# Patient Record
Sex: Female | Born: 1975 | Race: Black or African American | Hispanic: No | Marital: Single | Smoking: Never smoker
Health system: Southern US, Community
[De-identification: ages and names within clinical notes are randomized; demographics above are authoritative.]

---

## 1998-12-10 ENCOUNTER — Ambulatory Visit (HOSPITAL_BASED_OUTPATIENT_CLINIC_OR_DEPARTMENT_OTHER): Admission: RE | Admit: 1998-12-10 | Discharge: 1998-12-10 | Payer: Self-pay | Admitting: Orthopaedic Surgery

## 1999-09-23 ENCOUNTER — Emergency Department (HOSPITAL_COMMUNITY): Admission: EM | Admit: 1999-09-23 | Discharge: 1999-09-23 | Payer: Self-pay | Admitting: *Deleted

## 2000-11-04 ENCOUNTER — Emergency Department (HOSPITAL_COMMUNITY): Admission: EM | Admit: 2000-11-04 | Discharge: 2000-11-05 | Payer: Self-pay | Admitting: Emergency Medicine

## 2001-03-19 ENCOUNTER — Emergency Department (HOSPITAL_COMMUNITY): Admission: EM | Admit: 2001-03-19 | Discharge: 2001-03-21 | Payer: Self-pay | Admitting: Emergency Medicine

## 2001-03-20 ENCOUNTER — Encounter: Payer: Self-pay | Admitting: Emergency Medicine

## 2001-03-21 ENCOUNTER — Emergency Department (HOSPITAL_COMMUNITY): Admission: EM | Admit: 2001-03-21 | Discharge: 2001-03-21 | Payer: Self-pay | Admitting: Emergency Medicine

## 2002-01-25 ENCOUNTER — Other Ambulatory Visit: Admission: RE | Admit: 2002-01-25 | Discharge: 2002-01-25 | Payer: Self-pay | Admitting: Obstetrics and Gynecology

## 2002-01-25 ENCOUNTER — Other Ambulatory Visit: Admission: RE | Admit: 2002-01-25 | Discharge: 2002-01-25 | Payer: Self-pay | Admitting: Gynecology

## 2002-07-14 ENCOUNTER — Emergency Department (HOSPITAL_COMMUNITY): Admission: EM | Admit: 2002-07-14 | Discharge: 2002-07-14 | Payer: Self-pay | Admitting: Emergency Medicine

## 2002-07-30 ENCOUNTER — Encounter: Payer: Self-pay | Admitting: Emergency Medicine

## 2002-07-30 ENCOUNTER — Emergency Department (HOSPITAL_COMMUNITY): Admission: EM | Admit: 2002-07-30 | Discharge: 2002-07-30 | Payer: Self-pay | Admitting: Emergency Medicine

## 2002-10-07 ENCOUNTER — Emergency Department (HOSPITAL_COMMUNITY): Admission: EM | Admit: 2002-10-07 | Discharge: 2002-10-07 | Payer: Self-pay | Admitting: Emergency Medicine

## 2003-09-08 ENCOUNTER — Emergency Department (HOSPITAL_COMMUNITY): Admission: EM | Admit: 2003-09-08 | Discharge: 2003-09-08 | Payer: Self-pay | Admitting: Emergency Medicine

## 2004-07-17 ENCOUNTER — Emergency Department (HOSPITAL_COMMUNITY): Admission: EM | Admit: 2004-07-17 | Discharge: 2004-07-18 | Payer: Self-pay | Admitting: Emergency Medicine

## 2005-03-15 ENCOUNTER — Emergency Department (HOSPITAL_COMMUNITY): Admission: EM | Admit: 2005-03-15 | Discharge: 2005-03-16 | Payer: Self-pay | Admitting: Emergency Medicine

## 2005-03-17 ENCOUNTER — Other Ambulatory Visit: Admission: RE | Admit: 2005-03-17 | Discharge: 2005-03-17 | Payer: Self-pay | Admitting: Gynecology

## 2005-03-24 ENCOUNTER — Encounter (INDEPENDENT_AMBULATORY_CARE_PROVIDER_SITE_OTHER): Payer: Self-pay | Admitting: *Deleted

## 2005-03-25 ENCOUNTER — Inpatient Hospital Stay (HOSPITAL_COMMUNITY): Admission: RE | Admit: 2005-03-25 | Discharge: 2005-03-26 | Payer: Self-pay | Admitting: *Deleted

## 2005-03-30 ENCOUNTER — Ambulatory Visit (HOSPITAL_COMMUNITY): Admission: RE | Admit: 2005-03-30 | Discharge: 2005-03-30 | Payer: Self-pay | Admitting: *Deleted

## 2005-11-14 ENCOUNTER — Emergency Department (HOSPITAL_COMMUNITY): Admission: EM | Admit: 2005-11-14 | Discharge: 2005-11-14 | Payer: Self-pay | Admitting: Emergency Medicine

## 2006-02-08 ENCOUNTER — Emergency Department (HOSPITAL_COMMUNITY): Admission: EM | Admit: 2006-02-08 | Discharge: 2006-02-08 | Payer: Self-pay | Admitting: Emergency Medicine

## 2007-10-13 ENCOUNTER — Emergency Department (HOSPITAL_COMMUNITY): Admission: EM | Admit: 2007-10-13 | Discharge: 2007-10-13 | Payer: Self-pay | Admitting: *Deleted

## 2008-02-28 ENCOUNTER — Emergency Department (HOSPITAL_COMMUNITY): Admission: EM | Admit: 2008-02-28 | Discharge: 2008-02-28 | Payer: Self-pay | Admitting: Emergency Medicine

## 2008-11-05 ENCOUNTER — Emergency Department (HOSPITAL_COMMUNITY): Admission: EM | Admit: 2008-11-05 | Discharge: 2008-11-05 | Payer: Self-pay | Admitting: Emergency Medicine

## 2010-08-19 IMAGING — CR DG CHEST 2V
2 series · 2 of 2 positions shown · non-contrast
Comparison: 10/13/2007

CLINICAL DATA: Neck and back pain

CHEST - 2 VIEW

[view not recorded (1 of 2)]
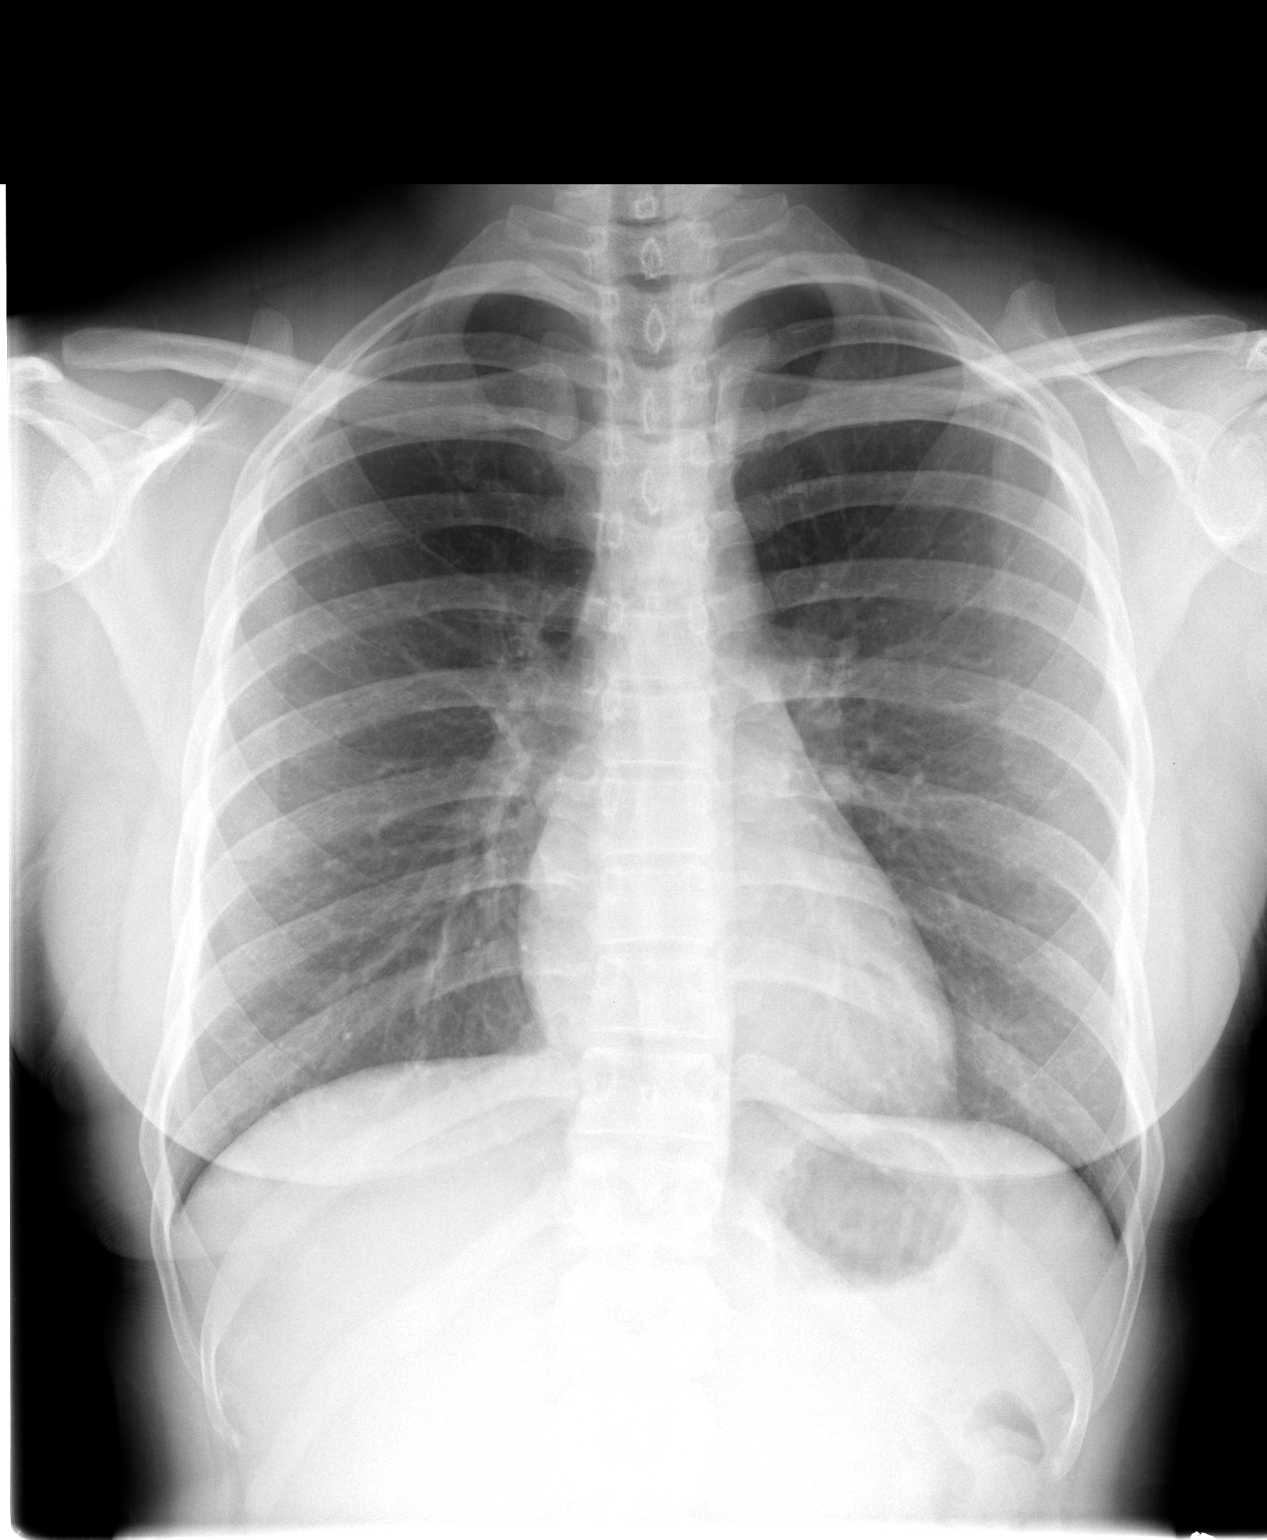

[view not recorded (2 of 2)]
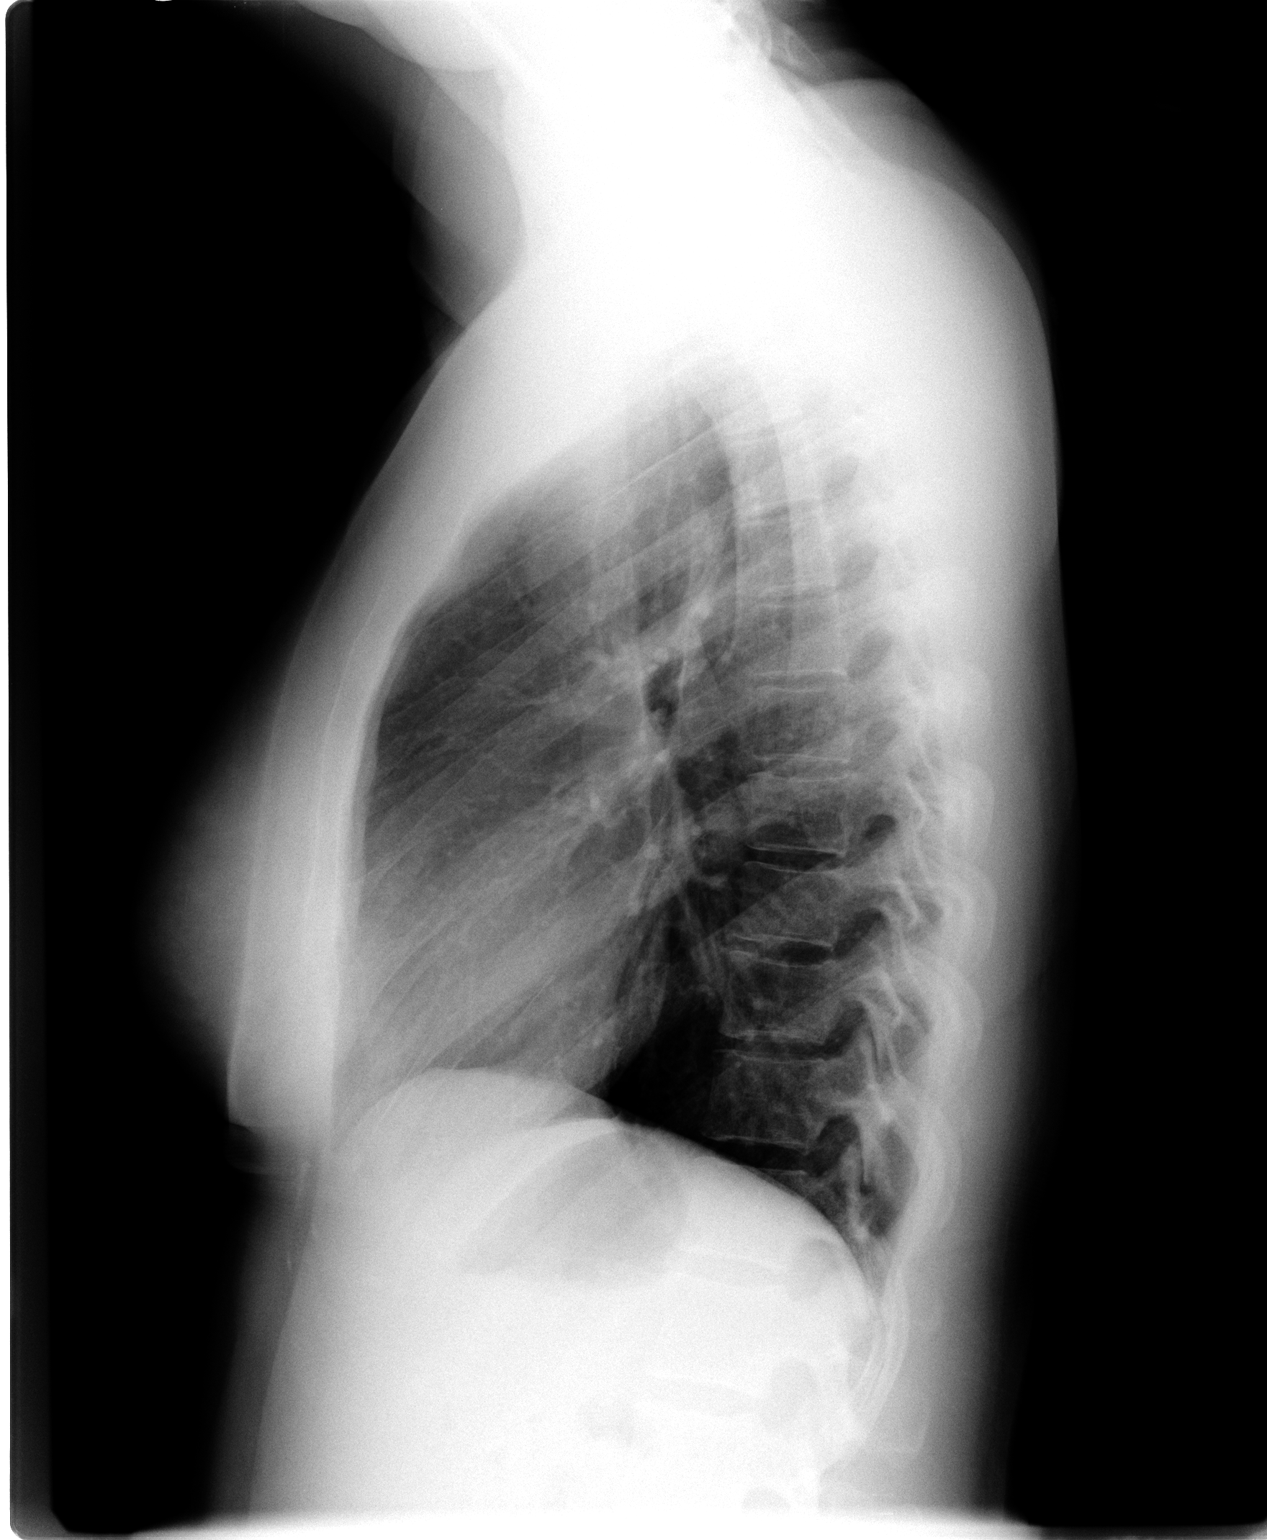

[2 of 2 positions shown; findings below may reference images not displayed]

FINDINGS: Heart and mediastinal contours normal.  Lungs clear.  No
pleural fluid.  Osseous structures and soft tissues unremarkable.
IMPRESSION: No acute or significant findings.

## 2010-11-21 NOTE — Op Note (Signed)
NAME:  Shelley Mcdaniel, Shelley Mcdaniel NO.:  [id number]   MEDICAL RECORD NO.:  [id number]          PATIENT TYPE:  OIB   LOCATION:  1617                         FACILITY:  [facility]   PHYSICIAN:  Almedia Balls. Fore, M.D.   DATE OF BIRTH:  December 13, 1975   DATE OF PROCEDURE:  03/24/2005  DATE OF DISCHARGE:                                 OPERATIVE REPORT   PREOPERATIVE DIAGNOSES:  1.  Pelvic pain.  2.  Uterine fibroids.  3.  Ovarian cyst.   POSTOPERATIVE DIAGNOSES:  1.  Pelvic pain.  2.  Uterine fibroids.  3.  Ovarian cyst.  4.  Pending pathology.   OPERATION:  1.  Abdominal myomectomy.  2.  Excision of lesion of right ovary.  3.  Chromopertubation.   ANESTHESIA:  General orotracheal.   OPERATOR:  Dr. Randell Patient   FIRST ASSISTANT:  Dr. Laureen Ochs   INDICATIONS FOR SURGERY:  The patient is a 35 year old with the above-noted  problems, who has been counseled as to the need for surgery to treat these  problems.  She has been fully counseled as to the nature of the procedure  and the risks involved to include risks of anesthesia; injury to uterus,  tubes, ovaries, bowel, bladder, blood vessels, ureters, postoperative  hemorrhage, infection, recuperation.  She fully understands all these  considerations and has signed informed consent to proceed on 24 March 2005.   OPERATIVE FINDINGS:  On entry into the abdomen, the upper abdominal viscera  to include liver, gallbladder, spleen, kidneys, periaortic areas were normal  to palpation.  The appendix appeared to be normal and was palpated as  normal.  In the pelvis, there were multiple myomata on the external surface  of the uterus with one approximately 2 cm myomata in the right posterior  intramural area.  There was corpus luteum present on the right ovary as well  as a solid lesion present at the distal pole of the right ovary as well.  Both tubes appeared normal with normal fimbria.  Attempted chromopertubation  was accomplished to  ensure the tubes were opened; however, no dye was  definitely seen to transmit either tube.   PROCEDURE:  With the patient under general anesthesia, prepared and draped  in the usual sterile fashion, speculum was placed in vagina.  The anterior  lip of the cervix was grasped with a single-tooth tenaculum, and the cervix  was dilated up through a number 15 Pratt dilator.  Pediatric Foley catheter  was then placed with the bulb inflated to ensure that it remained within the  endometrial cavity for insufflation with indigo carmine dye.  Foley catheter  was placed, and the patient was positioned for an abdominal procedure.  The  patient was then draped sterilely, and an incision was made in the lower  abdomen in a transverse fashion and carried into the peritoneal cavity  without difficulty.  Self-retaining retractor was placed, and the bowel was  packed off.  A solution of Pitressin 20 units and 30 mL saline were used at  the base of each fibroid which were then removed using sharp dissection with  Bovie electrocoagulation utilized for hemostasis as well.  There was 1 large  myoma at the anterior fundal surface which is sessile-type myoma without a  definite stalk.  The lesion on the right ovary was excised using Bovie  electrocoagulation and appeared to be a solid lesion.  The defects in the  uterus were closed in deep layers of 1 chromic catgut and/or 0 chromic  catgut.  The serosal layers were closed with baseball-type suture of 0 PDS.  Hemostasis was maintained using suture ligatures and/or Bovie  electrocoagulation.  The area was lavaged with copious amounts of lactated  Ringer solution and after noting that hemostasis was maintained and that  sponge and instrument counts were correct, the peritoneum was closed with a  continuous suture of 0 Vicryl.  Fascia was closed with 2 sutures of 0 Vicryl  which were brought from the lateral aspects of the incision and tied  separately in the  midline.  Subcutaneous fat was reapproximated with  interrupted sutures of 0 Vicryl.  Skin was closed with a subcuticular suture  of 3-0 plain catgut.  Estimated blood loss 150 mL.  The patient was taken to  the recovery room in good condition with clear urine in the Foley catheter  tubing.  The pediatric Foley catheter was removed from the intrauterine  cavity.  She will be placed on 23-hour observation following surgery.           ______________________________  Almedia Balls Randell Patient, M.D.     SRF/MEDQ  D:  03/24/2005  T:  03/24/2005  Job:  [id number]

## 2010-11-21 NOTE — Discharge Summary (Signed)
NAME:  BLASA, RAISCH NO.:  [id number]   MEDICAL RECORD NO.:  [id number]          PATIENT TYPE:  INP   LOCATION:  1617                         FACILITY:  [facility]   PHYSICIAN:  Almedia Balls. Fore, M.D.   DATE OF BIRTH:  May 03, 1976   DATE OF ADMISSION:  03/24/2005  DATE OF DISCHARGE:  03/26/2005                                 DISCHARGE SUMMARY   HISTORY OF PRESENT ILLNESS:  The patient is a 35 year old with pelvic pain  and leiomyomata uteri who had been evaluated by Dr. Chevis Pretty, and referred to  [place] for surgery.  The remainder of her history and physical as previously  dictated.   LABORATORY DATA:  Includes preoperative hemoglobin of 12.3.  Pregnancy test  was negative.   HOSPITAL COURSE:  The patient was taken to the operating room on March 24, 2005, at which time abdominal myomectomy, excision of lesion of right  ovary and chromopertubation were performed.  The patient did well  postoperatively.  Diet and ambulation was progressed over several days  postoperatively.  On the morning of March 26, 2005, she was afebrile,  and experiencing no problems except for pain which was controlled by oral  analgesics.  It was felt that she could be discharged at this time.   FINAL DIAGNOSES:  1.  Pelvic pain.  2.  Leiomyomata uteri.  3.  Lesion of right ovary with corpus luteum cyst, right ovary.   OPERATIONS:  1.  Abdominal myomectomy.  2.  Excision of lesion, right ovary.  3.  Chromopertubation.   PATHOLOGY:  Pathology report unavailable at the time of dictation.   DISPOSITION:  Discharged home to return to the office in 2 weeks for  followup.  She was instructed to gradually progress her activities over  several weeks at home and to limit lifting and driving for 2 weeks.  She was  fully  ambulatory, on a regular diet and in good condition at the time of  dictation.  She was given a prescription for Percocet, generic, #30, to be  taken 1 or 2 q.6-8h. p.r.n. pain  and doxycycline  100 mg, #12, to be taken 1 b.i.d.           ______________________________  Almedia Balls. Randell Patient, M.D.     SRF/MEDQ  D:  03/26/2005  T:  03/26/2005  Job:  [id number]   cc:   Leatha Gilding. Mezer, M.D.  [address]  [place]  [place] [address]  Fax: [phone]

## 2010-11-21 NOTE — H&P (Signed)
NAME:  Shelley Mcdaniel, Shelley Mcdaniel NO.:  [id number]   MEDICAL RECORD NO.:  [id number]           PATIENT TYPE:   LOCATION:                                 FACILITY:   PHYSICIAN:  Almedia Balls. Fore, M.D.        DATE OF BIRTH:   DATE OF ADMISSION:  DATE OF DISCHARGE:                                HISTORY & PHYSICAL   DATE OF ADMISSION:  March 24, 2005   CHIEF COMPLAINT:  Pain, fibroids, abnormal bleeding.   HISTORY:  The patient is a 35 year old gravida 0 whose last menstrual period  was March 04, 2005. She was initially seen by Dr. Teodora Medici on March 17, 2005 with the above-noted complaints. She had been seen in the emergency  room on September 10 with findings of the uterus being enlarged and no  evident etiology of the pain. Dr. Chevis Pretty performed ultrasound which showed  fibroids which were quite tender, with an enlarged uterus, and a cystic  structure on the right ovary approximately 2.5 x 1.6 x 1.8 cm. The pain has  continued and is not relieved by hydrocodone. She is admitted at this time  for exploratory laparotomy, myomectomy, and probable cystectomy as well. She  has been fully counseled as to the nature of the procedure and the risks  involved to include risks of anesthesia; injury to uterus, tubes, ovaries,  bowel, bladder, blood vessels, ureters; postoperative hemorrhage; infection;  recuperation. She fully understands all these considerations and wishes to  proceed on March 24, 2005.   PAST MEDICAL HISTORY:  Includes knee surgery in 2000. She is taking  Hydrocodone at this time for pain. She is allergic to no medications.   FAMILY HISTORY:  Includes a grandmother with hypertension. Father and  maternal aunt with cancer of unknown type.   The patient is a nonsmoker, nondrinker.   REVIEW OF SYSTEMS:  HEENT:  Negative. CARDIORESPIRATORY:  Negative.  GASTROINTESTINAL:  Negative. GENITOURINARY:  As in present illness.  NEUROMUSCULAR:  Negative.   PHYSICAL EXAMINATION:  VITAL SIGNS:  Height 5 feet 5 inches, weight 144  pounds, blood pressure 104/60, pulse 88, respirations 18.  GENERAL:  Well-developed black female in no acute distress.  HEENT:  Within normal limits.  NECK:  Supple without masses, adenopathy or bruits.  HEART:  Regular rate and rhythm without murmurs.  LUNGS:  Clear to P&A.  BREASTS:  Examined sitting and lying without mass.  ABDOMEN:  Flat and soft without mass being palpable. Tender in both lower  quadrants, right greater than left.  PELVIC:  External genitalia, Bartholin's, urethra, and Skene's glands within  normal limits. Cervix is slightly inflamed. Uterus is mid posterior,  approximately 10-[redacted] weeks gestational size, irregular, and tender. Adnexa  are tender bilaterally right greater than left with some fullness in the  right. Anterior and posterior cul-de-sac exam is confirmatory.  EXTREMITIES:  Within normal limits.  CENTRAL NERVOUS SYSTEM:  Grossly intact.  SKIN:  Without suspicious lesions.   IMPRESSION:  1.  Leiomyomata uteri.  2.  Ovarian cyst.   DISPOSITION:  Exploratory laparotomy, myomectomy, probable cystectomy  as  noted above. Pap smear done in September 2006 was normal.           ______________________________  Almedia Balls. Randell Patient, M.D.     SRF/MEDQ  D:  03/19/2005  T:  03/19/2005  Job:  [id number]

## 2011-03-31 LAB — POCT I-STAT, CHEM 8
BUN: 7
Chloride: 102
Creatinine, Ser: 1.2
Glucose, Bld: 88
Hemoglobin: 13.3
Potassium: 3.6
Sodium: 140

## 2011-03-31 LAB — POCT CARDIAC MARKERS
CKMB, poc: <1 — ABNORMAL LOW
Myoglobin, poc: 33.8
Troponin i, poc: <0.05

## 2017-09-18 ENCOUNTER — Emergency Department (HOSPITAL_COMMUNITY): Payer: BLUE CROSS/BLUE SHIELD

## 2017-09-18 ENCOUNTER — Other Ambulatory Visit: Payer: Self-pay

## 2017-09-18 ENCOUNTER — Encounter (HOSPITAL_COMMUNITY): Payer: Self-pay | Admitting: Emergency Medicine

## 2017-09-18 DIAGNOSIS — D649 Anemia, unspecified: Secondary | ICD-10-CM | POA: Insufficient documentation

## 2017-09-18 DIAGNOSIS — R0789 Other chest pain: Secondary | ICD-10-CM | POA: Insufficient documentation

## 2017-09-18 DIAGNOSIS — M546 Pain in thoracic spine: Secondary | ICD-10-CM | POA: Diagnosis not present

## 2017-09-18 LAB — I-STAT TROPONIN, ED: Troponin i, poc: 0 ng/mL (ref 0.00–0.08)

## 2017-09-18 NOTE — ED Triage Notes (Signed)
Reports picking up something earlier today feeling like she pulled something in her back.  Concerned because she now has pain radiating in the left chest under the breast.  [facility] and was given flexeril.  Took some 40 minutes pta with no relief.

## 2017-09-19 ENCOUNTER — Emergency Department (HOSPITAL_COMMUNITY)
Admission: EM | Admit: 2017-09-19 | Discharge: 2017-09-19 | Disposition: A | Discharge status: Home / Self Care | Payer: BLUE CROSS/BLUE SHIELD | Attending: Emergency Medicine | Admitting: Emergency Medicine

## 2017-09-19 DIAGNOSIS — M546 Pain in thoracic spine: Secondary | ICD-10-CM

## 2017-09-19 DIAGNOSIS — R0789 Other chest pain: Secondary | ICD-10-CM

## 2017-09-19 DIAGNOSIS — D649 Anemia, unspecified: Secondary | ICD-10-CM

## 2017-09-19 LAB — CBC
HEMATOCRIT: 28.9 % — AB (ref 36.0–46.0)
Hemoglobin: 8.6 g/dL — ABNORMAL LOW (ref 12.0–15.0)
MCH: 23.4 pg — ABNORMAL LOW (ref 26.0–34.0)
MCHC: 29.8 g/dL — ABNORMAL LOW (ref 30.0–36.0)
MCV: 78.7 fL (ref 78.0–100.0)
PLATELETS: 333 10*3/uL (ref 150–400)
RBC: 3.67 MIL/uL — AB (ref 3.87–5.11)
RDW: 15.8 % — ABNORMAL HIGH (ref 11.5–15.5)
WBC: 6.7 10*3/uL (ref 4.0–10.5)

## 2017-09-19 LAB — BASIC METABOLIC PANEL
Anion gap: 10 (ref 5–15)
BUN: 8 mg/dL (ref 6–20)
CO2: 23 mmol/L (ref 22–32)
Calcium: 8.8 mg/dL — ABNORMAL LOW (ref 8.9–10.3)
Chloride: 105 mmol/L (ref 101–111)
Creatinine, Ser: 0.81 mg/dL (ref 0.44–1.00)
GFR calc non Af Amer: >60 mL/min (ref >60)
Glucose, Bld: 109 mg/dL — ABNORMAL HIGH (ref 65–99)
POTASSIUM: 3.3 mmol/L — AB (ref 3.5–5.1)
SODIUM: 138 mmol/L (ref 135–145)

## 2017-09-19 LAB — IRON AND TIBC
IRON: 14 ug/dL — AB (ref 28–170)
SATURATION RATIOS: 3 % — AB (ref 10.4–31.8)
TIBC: 501 ug/dL — ABNORMAL HIGH (ref 250–450)
UIBC: 487 ug/dL

## 2017-09-19 LAB — RETICULOCYTES
RBC.: 3.76 MIL/uL — ABNORMAL LOW (ref 3.87–5.11)
RETIC COUNT ABSOLUTE: 75.2 10*3/uL (ref 19.0–186.0)
Retic Ct Pct: 2 % (ref 0.4–3.1)

## 2017-09-19 LAB — VITAMIN B12: Vitamin B-12: 269 pg/mL (ref 180–914)

## 2017-09-19 LAB — I-STAT BETA HCG BLOOD, ED (MC, WL, AP ONLY)

## 2017-09-19 LAB — FOLATE: FOLATE: 12.3 ng/mL (ref >5.9)

## 2017-09-19 LAB — FERRITIN: Ferritin: 3 ng/mL — ABNORMAL LOW (ref 11–307)

## 2017-09-19 LAB — D-DIMER, QUANTITATIVE: D-Dimer, Quant: 0.48 ug/mL-FEU (ref 0.00–0.50)

## 2017-09-19 MED ORDER — NAPROXEN 500 MG PO TABS: ORAL_TABLET | ORAL | 0 refills | Status: AC

## 2017-09-19 MED ORDER — KETOROLAC TROMETHAMINE 15 MG/ML IJ SOLN
30.0000 mg | Freq: Once | INTRAMUSCULAR | Status: AC
  Administered 2017-09-19: 30 mg via INTRAMUSCULAR
  Filled 2017-09-19: qty 2

## 2017-09-19 MED ORDER — FERROUS SULFATE 325 (65 FE) MG PO TABS: 325.0000 mg | ORAL_TABLET | Freq: Three times a day (TID) | ORAL | 0 refills | Status: AC

## 2017-09-19 MED ORDER — DIAZEPAM 2 MG PO TABS
2.0000 mg | ORAL_TABLET | Freq: Once | ORAL | Status: AC
  Administered 2017-09-19: 2 mg via ORAL
  Filled 2017-09-19: qty 1

## 2017-09-19 MED ORDER — CYCLOBENZAPRINE HCL 10 MG PO TABS: 10.0000 mg | ORAL_TABLET | Freq: Three times a day (TID) | ORAL | 0 refills | Status: AC | PRN

## 2017-09-19 MED ORDER — OXYCODONE-ACETAMINOPHEN 5-325 MG PO TABS
1.0000 | ORAL_TABLET | ORAL | Status: DC | PRN
  Administered 2017-09-19: 1 via ORAL
  Filled 2017-09-19: qty 1

## 2017-09-19 NOTE — Discharge Instructions (Signed)
Use ice and heat on the painful areas for comfort. Take the medications for pain as prescribed. You are also anemic, take the iron pills 3 times a day (or you can take Ferrous Sequels OTC) for the next several months. You need to get a primary care doctor to follow up on your anemia to make sure you are improving on the iron pills.  Recheck if you feel worse.

## 2017-09-19 NOTE — ED Provider Notes (Addendum)
[facility] EMERGENCY DEPARTMENT Provider Note   CSN: [id number] Arrival date & time: 09/18/17  2322  Time seen 05:55 AM  History   Chief Complaint Chief Complaint  Patient presents with  . Chest Pain  . Back Pain    HPI Tom Ragsdale is a 42 y.o. female.  HPI  Patient states about 4:30 PM on March 16 she was going to carry bags down the stairs and when she reached for the bags and felt an acute onset of left posterior chest pain that radiates into her left anterior chest under her breast. There is a constant aching, pressure pain with a sharp pleuritic component. She states she feels like her organs are twisted.  States she got percocet in triage which helped but is now wearing off. She denies swelling of her legs or pain in her legs, cough, fever. She denies having this pain before.  Denies any family history of heart disease or blood clotting problems.  She states her family history is mainly with hypertension.  She denies being on birth control pills.  She does report having fatigue and being cold natured.  She denies having heavy periods.  PCP none  History reviewed. No pertinent past medical history.  There are no active problems to display for this patient.   History reviewed. No pertinent surgical history.  OB History    No data available       Home Medications    Prior to Admission medications   Medication Sig Start Date End Date Taking? Authorizing Provider  cyclobenzaprine (FLEXERIL) 10 MG tablet Take 1 tablet (10 mg total) by mouth 3 (three) times daily as needed for muscle spasms. 09/19/17   Devoria Albe, MD  ferrous sulfate 325 (65 FE) MG tablet Take 1 tablet (325 mg total) by mouth 3 (three) times daily with meals. 09/19/17   Devoria Albe, MD  naproxen (NAPROSYN) 500 MG tablet Take 1 po BID with food prn pain 09/19/17   Devoria Albe, MD    Family History No family history on file.  Social History Social History   Tobacco Use  . Smoking  status: Never Smoker  . Smokeless tobacco: Never Used  Substance Use Topics  . Alcohol use: Yes    Comment: occasionally  . Drug use: No  employed   Allergies   Patient has no known allergies.   Review of Systems Review of Systems  All other systems reviewed and are negative.    Physical Exam Updated Vital Signs BP 102/67   Pulse 65   Temp 98.2 F (36.8 C) (Oral)   Resp 20   Ht 5\' 3"  (1.6 m)   Wt 86.2 kg (190 lb)   SpO2 100%   BMI 33.66 kg/m   Vital signs normal    Physical Exam  Constitutional: She is oriented to person, place, and time. She appears well-developed and well-nourished.  Non-toxic appearance. She does not appear ill. No distress.  HENT:  Head: Normocephalic and atraumatic.  Right Ear: External ear normal.  Left Ear: External ear normal.  Nose: Nose normal. No mucosal edema or rhinorrhea.  Mouth/Throat: Oropharynx is clear and moist and mucous membranes are normal. No dental abscesses or uvula swelling.  Eyes: Conjunctivae and EOM are normal. Pupils are equal, round, and reactive to light.  Neck: Normal range of motion and full passive range of motion without pain. Neck supple.  Cardiovascular: Normal rate, regular rhythm and normal heart sounds. Exam reveals no gallop and no  friction rub.  No murmur heard. Pulmonary/Chest: Effort normal and breath sounds normal. No respiratory distress. She has no wheezes. She has no rhonchi. She has no rales. She exhibits no tenderness and no crepitus.  Area of pain noted, she states it is only mildly tender to palpation    Abdominal: Soft. Normal appearance and bowel sounds are normal. She exhibits no distension. There is no tenderness. There is no rebound and no guarding.  Musculoskeletal: Normal range of motion. She exhibits no edema or tenderness.       Back:  Moves all extremities well.  Patient has tenderness in the lower thoracic spine in the midline but mainly on the left of midline.  There is no rash  seen.  Neurological: She is alert and oriented to person, place, and time. She has normal strength. No cranial nerve deficit.  Skin: Skin is warm, dry and intact. No rash noted. No erythema. No pallor.  Psychiatric: She has a normal mood and affect. Her speech is normal and behavior is normal. Her mood appears not anxious.  Nursing note and vitals reviewed.    ED Treatments / Results  Labs (all labs ordered are listed, but only abnormal results are displayed) Results for orders placed or performed during the hospital encounter of 09/19/17  Basic metabolic panel  Result Value Ref Range   Sodium 138 135 - 145 mmol/L   Potassium 3.3 (L) 3.5 - 5.1 mmol/L   Chloride 105 101 - 111 mmol/L   CO2 23 22 - 32 mmol/L   Glucose, Bld 109 (H) 65 - 99 mg/dL   BUN 8 6 - 20 mg/dL   Creatinine, Ser 6.38 0.44 - 1.00 mg/dL   Calcium 8.8 (L) 8.9 - 10.3 mg/dL   GFR calc non Af Amer >60 >60 mL/min   GFR calc Af Amer >60 >60 mL/min   Anion gap 10 5 - 15  CBC  Result Value Ref Range   WBC 6.7 4.0 - 10.5 K/uL   RBC 3.67 (L) 3.87 - 5.11 MIL/uL   Hemoglobin 8.6 (L) 12.0 - 15.0 g/dL   HCT 75.6 (L) 43.3 - 29.5 %   MCV 78.7 78.0 - 100.0 fL   MCH 23.4 (L) 26.0 - 34.0 pg   MCHC 29.8 (L) 30.0 - 36.0 g/dL   RDW 18.8 (H) 41.6 - 60.6 %   Platelets 333 150 - 400 K/uL  D-dimer, quantitative  Result Value Ref Range   D-Dimer, Quant 0.48 0.00 - 0.50 ug/mL-FEU  Vitamin B12  Result Value Ref Range   Vitamin B-12 269 180 - 914 pg/mL  Folate  Result Value Ref Range   Folate 12.3 >5.9 ng/mL  Iron and TIBC  Result Value Ref Range   Iron 14 (L) 28 - 170 ug/dL   TIBC 301 (H) 601 - 093 ug/dL   Saturation Ratios 3 (L) 10.4 - 31.8 %   UIBC 487 ug/dL  Ferritin  Result Value Ref Range   Ferritin 3 (L) 11 - 307 ng/mL  Reticulocytes  Result Value Ref Range   Retic Ct Pct 2.0 0.4 - 3.1 %   RBC. 3.76 (L) 3.87 - 5.11 MIL/uL   Retic Count, Absolute 75.2 19.0 - 186.0 K/uL  I-stat troponin, ED  Result Value Ref Range     Troponin i, poc 0.00 0.00 - 0.08 ng/mL   Comment 3          I-Stat beta hCG blood, ED  Result Value Ref Range   I-stat hCG,  quantitative <5.0 <5 mIU/mL   Comment 3           Laboratory interpretation all normal except for anemia that was not present in 2012, anemia panel c/w iron deficiency anemia    EKG  EKG Interpretation  Date/Time:  Saturday September 18 2017 23:32:27 EDT Ventricular Rate:  78 PR Interval:  142 QRS Duration: 74 QT Interval:  354 QTC Calculation: 403 R Axis:   74 Text Interpretation:  Sinus rhythm with marked sinus arrhythmia Otherwise normal ECG No significant change since last tracing 13 Oct 2007 Confirmed by Devoria AlbeKnapp, Aaliyah Cancro ([id number]) on 09/19/2017 5:37:18 AM       Radiology Dg Chest 2 View  Result Date: 09/19/2017 CLINICAL DATA:  Chest pain. EXAM: CHEST - 2 VIEW COMPARISON:  11/05/2008 FINDINGS: The cardiomediastinal contours are normal. The lungs are clear. Pulmonary vasculature is normal. No consolidation, pleural effusion, or pneumothorax. No acute osseous abnormalities are seen. IMPRESSION: Unremarkable radiographs of the chest. Electronically Signed   By: Rubye OaksMelanie  Ehinger M.D.   On: 09/19/2017 00:31    Procedures Procedures (including critical care time)  Medications Ordered in ED Medications  oxyCODONE-acetaminophen (PERCOCET/ROXICET) 5-325 MG per tablet 1 tablet (1 tablet Oral Given 09/19/17 0319)  ketorolac (TORADOL) 15 MG/ML injection 30 mg (30 mg Intramuscular Given 09/19/17 0630)  diazepam (VALIUM) tablet 2 mg (2 mg Oral Given 09/19/17 0630)     Initial Impression / Assessment and Plan / ED Course  I have reviewed the triage vital signs and the nursing notes.  Pertinent labs & imaging results that were available during my care of the patient were reviewed by me and considered in my medical decision making (see chart for details).     Patient's pain sounds musculoskeletal although less likely she could have a PE.  Patient is willing to have a  d-dimer screen done.  She denies having heavy periods however her indices on her CBC are consistent with a iron deficiency anemia.  When her d-dimer was drawn and anemia panel was also drawn.  We discussed being discharged home on iron pills and to follow-up in about 6-8 weeks to see if her anemia is improving.  07:25 AM patients ddimer is normal, we discussed her test results. She states her pain is better after the meds she got. We discussed getting a primary care doctor and to take the iron pills for the next several months.  She does states that she chews ice which also goes along with iron deficiency anemia.  We discussed that sometimes the iron pills can cause constipation, if they do she can take Ferro-Sequels over-the-counter which are less constipating.  Final Clinical Impressions(s) / ED Diagnoses   Final diagnoses:  Chest wall pain  Acute left-sided thoracic back pain  Anemia, unspecified type    ED Discharge Orders        Ordered    ferrous sulfate 325 (65 FE) MG tablet  3 times daily with meals     09/19/17 0735    naproxen (NAPROSYN) 500 MG tablet     09/19/17 0736    cyclobenzaprine (FLEXERIL) 10 MG tablet  3 times daily PRN     09/19/17 0736      Plan discharge  Devoria AlbeIva Thailan Sava, MD, Concha PyoFACEP    Lynne Takemoto, MD 09/19/17 [id number]    Devoria AlbeKnapp, Thomasina Housley, MD 09/19/17 [phone]

## 2017-09-19 NOTE — ED Notes (Signed)
Dr. Knapp at bedside at this time. 

## 2019-07-02 IMAGING — CR DG CHEST 2V
2 series · 2 of 2 positions shown · non-contrast
Comparison: 11/05/2008

CLINICAL DATA: Chest pain.

EXAM:
CHEST - 2 VIEW

[chest pa]
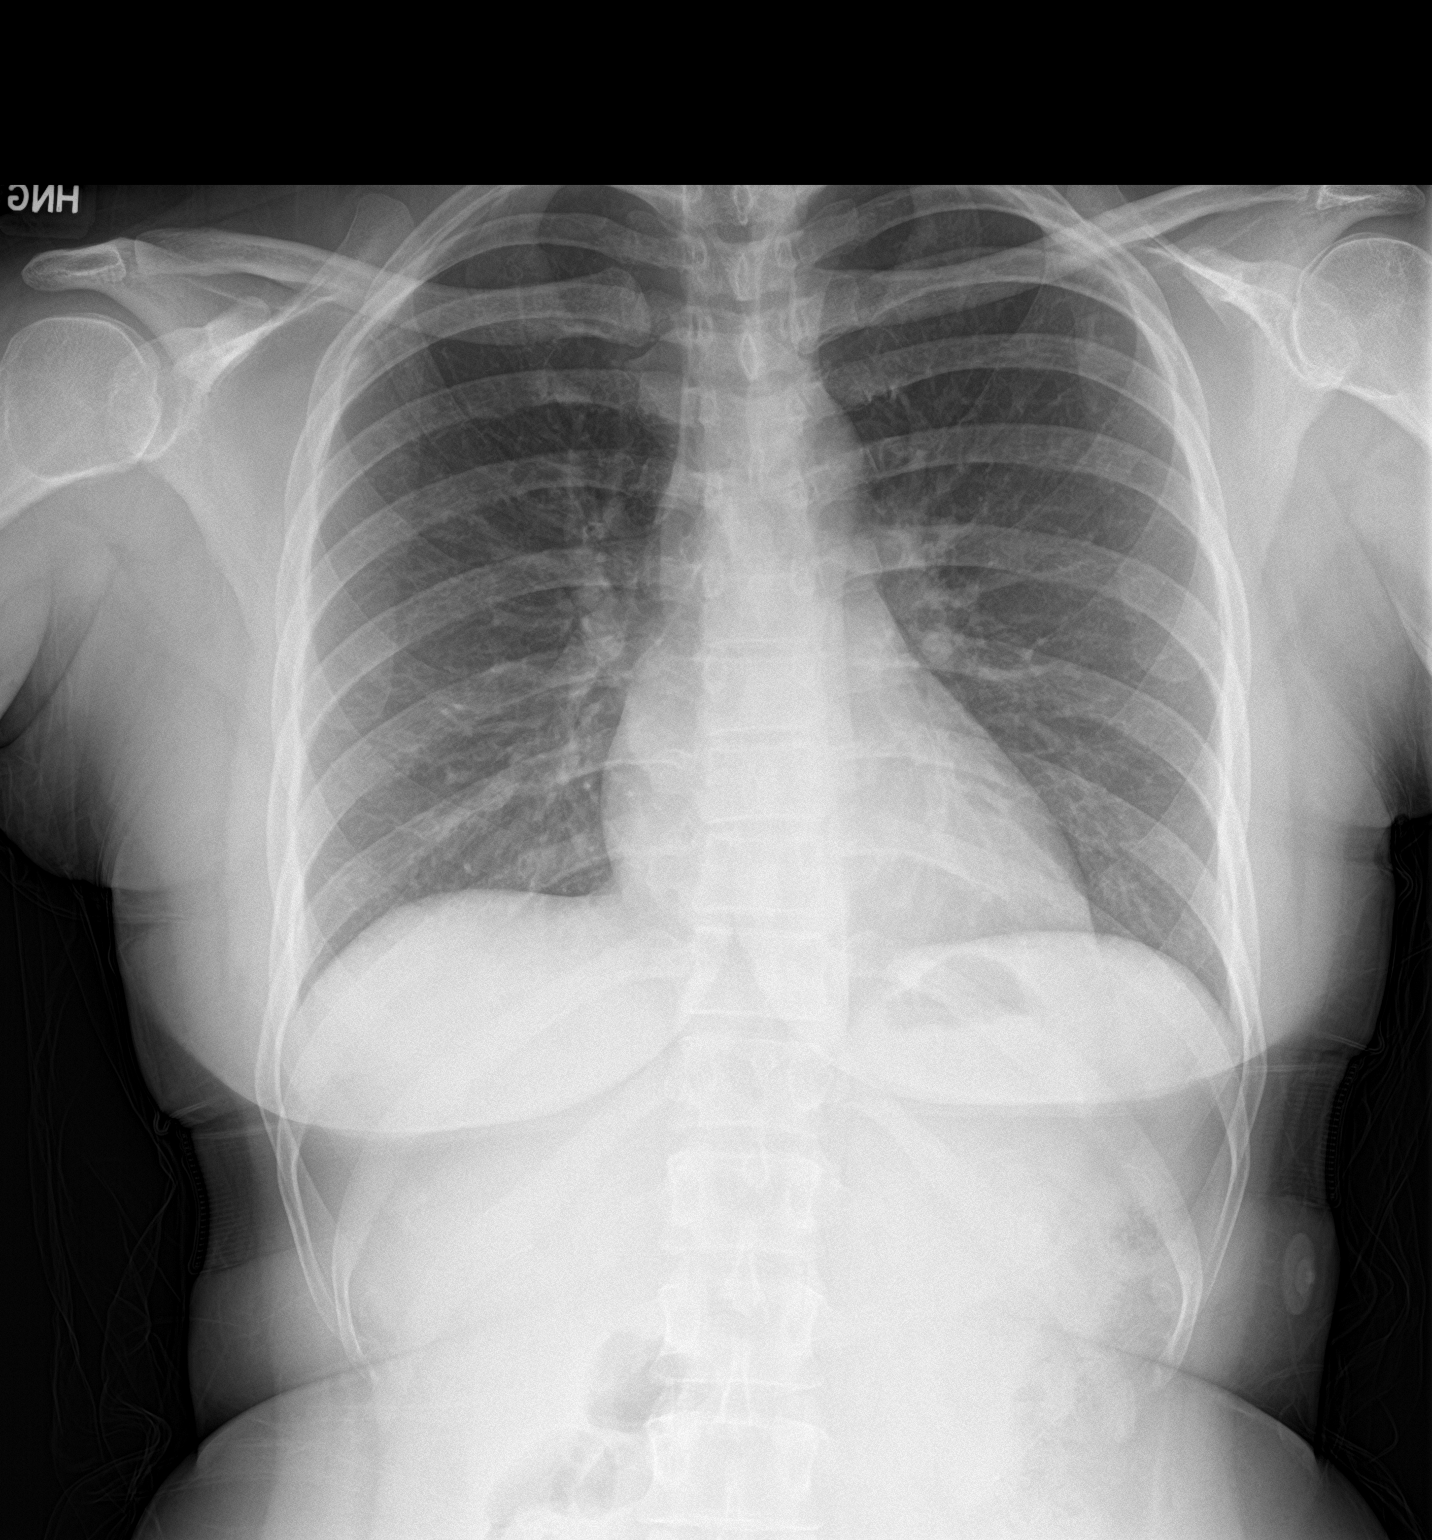

[chest lat]
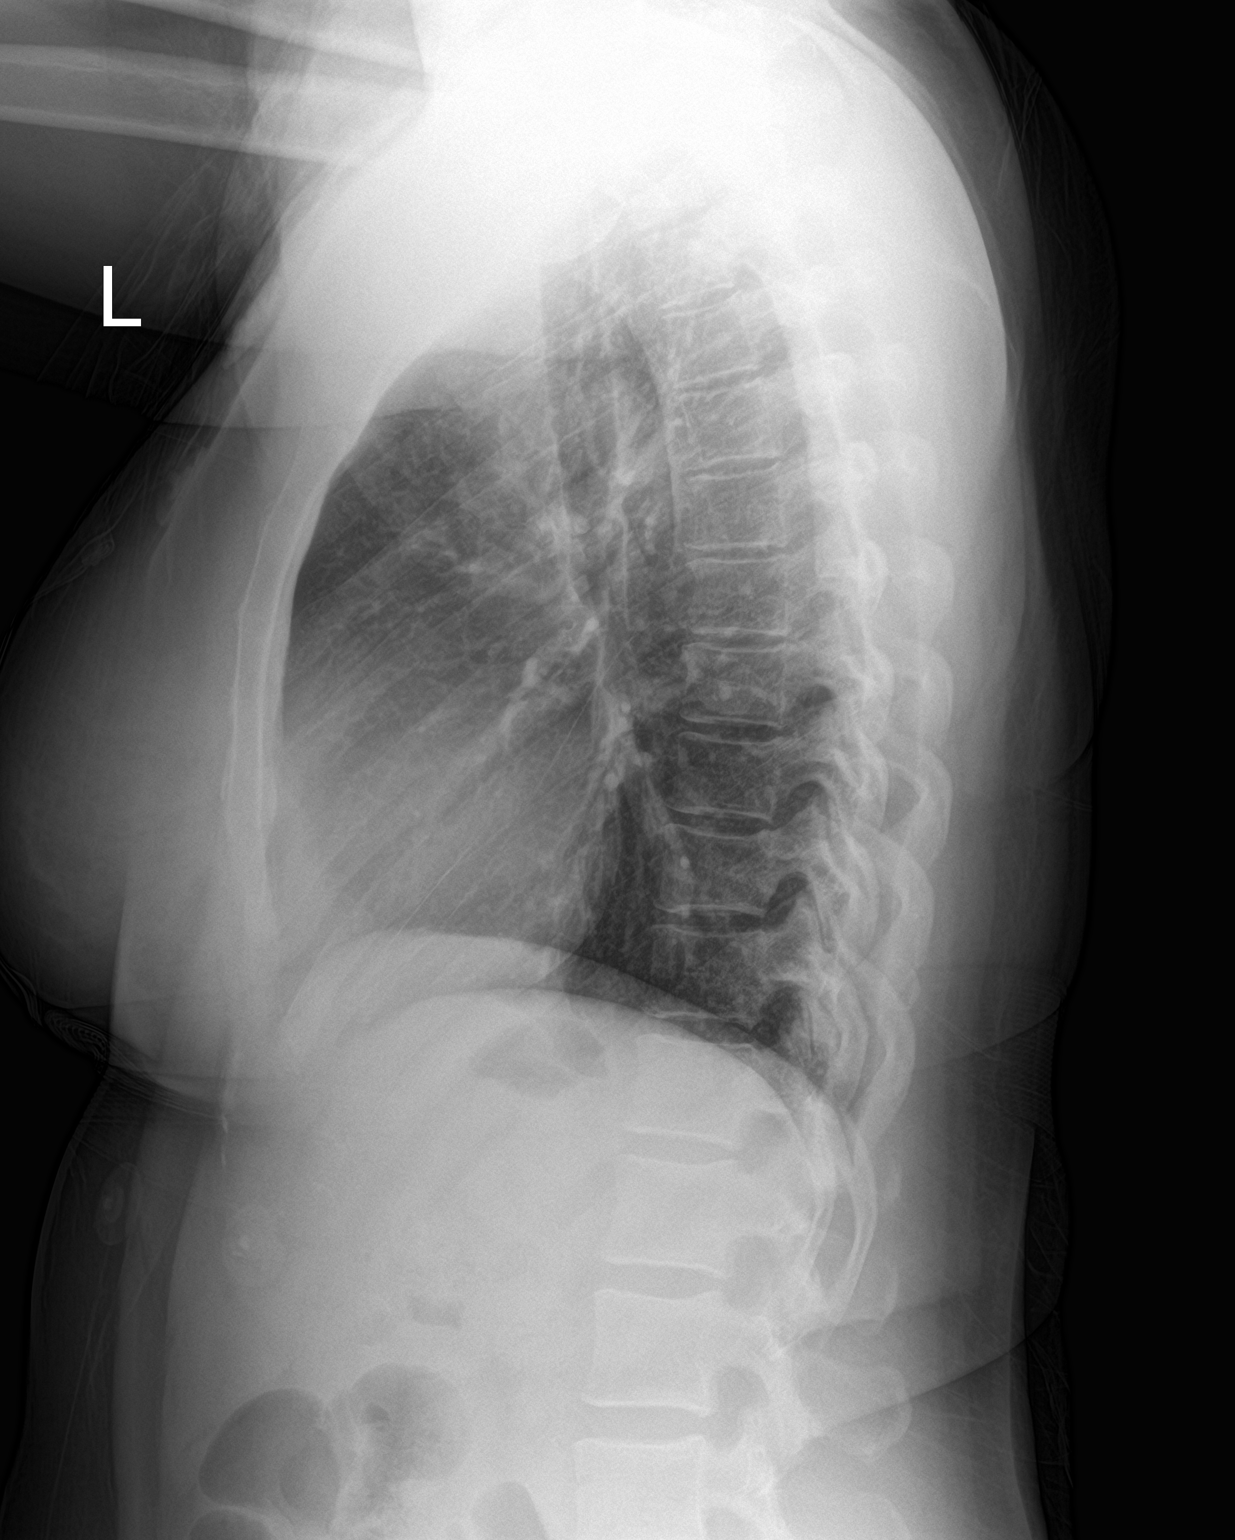

[2 of 2 positions shown; findings below may reference images not displayed]

FINDINGS: The cardiomediastinal contours are normal. The lungs are clear.
Pulmonary vasculature is normal. No consolidation, pleural effusion,
or pneumothorax. No acute osseous abnormalities are seen.
IMPRESSION: Unremarkable radiographs of the chest.

## 2022-02-26 ENCOUNTER — Other Ambulatory Visit: Payer: Self-pay

## 2022-02-26 ENCOUNTER — Encounter (HOSPITAL_COMMUNITY): Payer: Self-pay | Admitting: Emergency Medicine

## 2022-02-26 ENCOUNTER — Emergency Department (HOSPITAL_COMMUNITY)
Admission: EM | Admit: 2022-02-26 | Discharge: 2022-02-26 | Disposition: A | Discharge status: Home / Self Care | Payer: BC Managed Care – PPO | Attending: Emergency Medicine | Admitting: Emergency Medicine

## 2022-02-26 ENCOUNTER — Emergency Department (HOSPITAL_COMMUNITY): Payer: BC Managed Care – PPO

## 2022-02-26 DIAGNOSIS — I959 Hypotension, unspecified: Secondary | ICD-10-CM | POA: Diagnosis present

## 2022-02-26 DIAGNOSIS — U071 COVID-19: Secondary | ICD-10-CM | POA: Diagnosis not present

## 2022-02-26 LAB — CBC WITH DIFFERENTIAL/PLATELET
Abs Immature Granulocytes: 0.01 10*3/uL (ref 0.00–0.07)
Basophils Absolute: 0 10*3/uL (ref 0.0–0.1)
Basophils Relative: 0 %
Eosinophils Absolute: 0 10*3/uL (ref 0.0–0.5)
Eosinophils Relative: 0 %
HCT: 37 % (ref 36.0–46.0)
Hemoglobin: 12.1 g/dL (ref 12.0–15.0)
Immature Granulocytes: 0 %
Lymphocytes Relative: 13 %
Lymphs Abs: 0.6 10*3/uL — ABNORMAL LOW (ref 0.7–4.0)
MCH: 31.2 pg (ref 26.0–34.0)
MCHC: 32.7 g/dL (ref 30.0–36.0)
MCV: 95.4 fL (ref 80.0–100.0)
Monocytes Absolute: 0.4 10*3/uL (ref 0.1–1.0)
Monocytes Relative: 8 %
Neutro Abs: 3.9 10*3/uL (ref 1.7–7.7)
Neutrophils Relative %: 79 %
Platelets: 234 10*3/uL (ref 150–400)
RBC: 3.88 MIL/uL (ref 3.87–5.11)
RDW: 12.7 % (ref 11.5–15.5)
WBC: 5 10*3/uL (ref 4.0–10.5)
nRBC: 0 % (ref 0.0–0.2)

## 2022-02-26 LAB — COMPREHENSIVE METABOLIC PANEL
ALT: 12 U/L (ref 0–44)
AST: 16 U/L (ref 15–41)
Albumin: 3.6 g/dL (ref 3.5–5.0)
Alkaline Phosphatase: 28 U/L — ABNORMAL LOW (ref 38–126)
Anion gap: 9 (ref 5–15)
BUN: 7 mg/dL (ref 6–20)
CO2: 22 mmol/L (ref 22–32)
Calcium: 8.8 mg/dL — ABNORMAL LOW (ref 8.9–10.3)
Chloride: 108 mmol/L (ref 98–111)
Creatinine, Ser: 0.84 mg/dL (ref 0.44–1.00)
GFR, Estimated: >60 mL/min (ref >60)
Glucose, Bld: 102 mg/dL — ABNORMAL HIGH (ref 70–99)
Potassium: 4.1 mmol/L (ref 3.5–5.1)
Sodium: 139 mmol/L (ref 135–145)
Total Bilirubin: 0.8 mg/dL (ref 0.3–1.2)
Total Protein: 7 g/dL (ref 6.5–8.1)

## 2022-02-26 LAB — LACTIC ACID, PLASMA: Lactic Acid, Venous: 0.9 mmol/L (ref 0.5–1.9)

## 2022-02-26 NOTE — ED Triage Notes (Signed)
Pt sent from urgent care. Was seen there yesterday and was covid positive. Given steroid dosepak which she started this morning and then began vomiting.  BP at home noted to be in the 80s systolic so went back to [facility] where it was in the 90s and was told she needed to come here for blood work and possible re hydration.

## 2022-02-26 NOTE — ED Provider Triage Note (Signed)
Emergency Medicine Provider Triage Evaluation Note  Shelley Mcdaniel , a 46 y.o. female  was evaluated in triage.  Pt complains of covid. Has covid sxs x 4 days which includes fatigue, cough, n/v.  Seen at [facility] yesterday and was prescribed prednisone after positive covid test.  Developing vomiting today and [facility] when she was noted to be hypotensive and sent here.  Denies any significant medical hx.  No sob.  Review of Systems  Positive: As above Negative: As above  Physical Exam  BP (!) 104/59 (BP Location: Left Arm)   Pulse 68   Temp 98.9 F (37.2 C) (Oral)   Resp 16   SpO2 100%  Gen:   Awake, no distress   Resp:  Normal effort  MSK:   Moves extremities without difficulty  Other:    Medical Decision Making  Medically screening exam initiated at 1:01 PM.  Appropriate orders placed.  Shelley Mcdaniel was informed that the remainder of the evaluation will be completed by another provider, this initial triage assessment does not replace that evaluation, and the importance of remaining in the ED until their evaluation is complete.     Fayrene Helper, PA-C 02/26/22 1303

## 2022-02-26 NOTE — Discharge Instructions (Addendum)
You should quarantine from 10 days from the start of your symptoms.  Keep yourself well-hydrated at home.  Drink plenty of water.

## 2022-02-26 NOTE — ED Provider Notes (Signed)
[facility] EMERGENCY DEPARTMENT Provider Note   CSN: [id number] Arrival date & time: 02/26/22  1211     History  Chief Complaint  Patient presents with   Hypotension    Shelley Mcdaniel is a 46 y.o. female presented to ED with concern for low blood pressure from urgent care today.  Patient reports she tested positive for COVID recently, says symptoms for 4 days, with myalgia, headache, low energy, nausea and vomiting.  She was prescribed a Medrol Dosepak which she tried to take yesterday but feels that it made her extremely sick after taking it and she vomited.  She is otherwise been able to keep down fluids fine.  She is here because she was told her blood pressure was "too low" in urgent care.  She reports she chronically has low to normal blood pressure.  She does have a history of iron deficiency anemia.  HPI     Home Medications Prior to Admission medications   Medication Sig Start Date End Date Taking? Authorizing Provider  cyclobenzaprine (FLEXERIL) 10 MG tablet Take 1 tablet (10 mg total) by mouth 3 (three) times daily as needed for muscle spasms. 09/19/17   Devoria Albe, MD  ferrous sulfate 325 (65 FE) MG tablet Take 1 tablet (325 mg total) by mouth 3 (three) times daily with meals. 09/19/17   Devoria Albe, MD  naproxen (NAPROSYN) 500 MG tablet Take 1 po BID with food prn pain 09/19/17   Devoria Albe, MD      Allergies    Patient has no known allergies.    Review of Systems   Review of Systems  Physical Exam Updated Vital Signs BP 112/62 (BP Location: Right Arm)   Pulse (!) 57   Temp 98.2 F (36.8 C) (Oral)   Resp 18   LMP 02/26/2022   SpO2 100%  Physical Exam Constitutional:      General: She is not in acute distress. HENT:     Head: Normocephalic and atraumatic.  Eyes:     Conjunctiva/sclera: Conjunctivae normal.     Pupils: Pupils are equal, round, and reactive to light.  Cardiovascular:     Rate and Rhythm: Normal rate and regular rhythm.   Pulmonary:     Effort: Pulmonary effort is normal. No respiratory distress.  Abdominal:     General: There is no distension.     Tenderness: There is no abdominal tenderness.  Skin:    General: Skin is warm and dry.  Neurological:     General: No focal deficit present.     Mental Status: She is alert. Mental status is at baseline.  Psychiatric:        Mood and Affect: Mood normal.        Behavior: Behavior normal.     ED Results / Procedures / Treatments   Labs (all labs ordered are listed, but only abnormal results are displayed) Labs Reviewed  COMPREHENSIVE METABOLIC PANEL - Abnormal; Notable for the following components:      Result Value   Glucose, Bld 102 (*)    Calcium 8.8 (*)    Alkaline Phosphatase 28 (*)    All other components within normal limits  CBC WITH DIFFERENTIAL/PLATELET - Abnormal; Notable for the following components:   Lymphs Abs 0.6 (*)    All other components within normal limits  LACTIC ACID, PLASMA  URINALYSIS, ROUTINE W REFLEX MICROSCOPIC  I-STAT BETA HCG BLOOD, ED ([facility], [facility], [facility] ONLY)    EKG None  Radiology DG  Chest 2 View  Result Date: 02/26/2022 CLINICAL DATA:  COVID. EXAM: CHEST - 2 VIEW COMPARISON:  Chest x-ray September 18, 2017 FINDINGS: The heart size and mediastinal contours are within normal limits. Both lungs are clear. No visible pleural effusions or pneumothorax. No acute osseous abnormality. IMPRESSION: No active cardiopulmonary disease. Electronically Signed   By: Feliberto Harts M.D.   On: 02/26/2022 13:21    Procedures Procedures    Medications Ordered in ED Medications - No data to display  ED Course/ Medical Decision Making/ A&P                           Medical Decision Making  Shelley Mcdaniel was evaluated in Emergency Department on 02/26/2022 for the symptoms described in the history of present illness. She was evaluated in the context of the global COVID-19 pandemic, which necessitated consideration that the patient  might be at risk for infection with the SARS-CoV-2 virus that causes COVID-19. Institutional protocols and algorithms that pertain to the evaluation of patients at risk for COVID-19 are in a state of rapid change based on information released by regulatory bodies including the CDC and federal and state organizations. These policies and algorithms were followed during the patient's care in the ED.  Patient is here on day 4 of COVID viral illness.  No respiratory distress.  No signs or symptoms of sepsis.  Her blood pressure is low to normal, but I reviewed her external records, she appears to run in this range all the time.  I also reviewed interpreted her labs today which show normal lactate, normal renal function, no evidence of significant dehydration.  No acute anemia.  I believe she is reasonably safe and stable for discharge.  Advised to continue drinking plenty of fluids at home.  She is here with her partner as well.  She does not have any comorbidities as an indication for antiviral therapy.  No hx of lung disease.        Final Clinical Impression(s) / ED Diagnoses Final diagnoses:  COVID-19    Rx / DC Orders ED Discharge Orders     None         Nicle Connole, Kermit Balo, MD 02/26/22 [phone]

## 2022-09-28 ENCOUNTER — Emergency Department (HOSPITAL_COMMUNITY)
Admission: EM | Admit: 2022-09-28 | Discharge: 2022-09-28 | Disposition: A | Discharge status: Home / Self Care | Payer: BC Managed Care – PPO | Attending: Emergency Medicine | Admitting: Emergency Medicine

## 2022-09-28 ENCOUNTER — Encounter (HOSPITAL_COMMUNITY): Payer: Self-pay

## 2022-09-28 ENCOUNTER — Emergency Department (HOSPITAL_COMMUNITY): Payer: BC Managed Care – PPO

## 2022-09-28 DIAGNOSIS — R1030 Lower abdominal pain, unspecified: Secondary | ICD-10-CM | POA: Diagnosis present

## 2022-09-28 DIAGNOSIS — D259 Leiomyoma of uterus, unspecified: Secondary | ICD-10-CM

## 2022-09-28 LAB — CBC WITH DIFFERENTIAL/PLATELET
Abs Immature Granulocytes: 0 10*3/uL (ref 0.00–0.07)
Basophils Absolute: 0 10*3/uL (ref 0.0–0.1)
Basophils Relative: 1 %
Eosinophils Absolute: 0.1 10*3/uL (ref 0.0–0.5)
Eosinophils Relative: 2 %
HCT: 34.7 % — ABNORMAL LOW (ref 36.0–46.0)
Hemoglobin: 10.6 g/dL — ABNORMAL LOW (ref 12.0–15.0)
Immature Granulocytes: 0 %
Lymphocytes Relative: 45 %
Lymphs Abs: 2 10*3/uL (ref 0.7–4.0)
MCH: 26.2 pg (ref 26.0–34.0)
MCHC: 30.5 g/dL (ref 30.0–36.0)
MCV: 85.7 fL (ref 80.0–100.0)
Monocytes Absolute: 0.3 10*3/uL (ref 0.1–1.0)
Monocytes Relative: 7 %
Neutro Abs: 1.9 10*3/uL (ref 1.7–7.7)
Neutrophils Relative %: 45 %
Platelets: 358 10*3/uL (ref 150–400)
RBC: 4.05 MIL/uL (ref 3.87–5.11)
RDW: 14 % (ref 11.5–15.5)
WBC: 4.2 10*3/uL (ref 4.0–10.5)
nRBC: 0 % (ref 0.0–0.2)

## 2022-09-28 LAB — COMPREHENSIVE METABOLIC PANEL
ALT: 15 U/L (ref 0–44)
AST: 15 U/L (ref 15–41)
Albumin: 4.1 g/dL (ref 3.5–5.0)
Alkaline Phosphatase: 40 U/L (ref 38–126)
Anion gap: 7 (ref 5–15)
BUN: 10 mg/dL (ref 6–20)
CO2: 24 mmol/L (ref 22–32)
Calcium: 9.2 mg/dL (ref 8.9–10.3)
Chloride: 104 mmol/L (ref 98–111)
Creatinine, Ser: 0.7 mg/dL (ref 0.44–1.00)
GFR, Estimated: >60 mL/min (ref >60)
Glucose, Bld: 98 mg/dL (ref 70–99)
Potassium: 4.3 mmol/L (ref 3.5–5.1)
Sodium: 135 mmol/L (ref 135–145)
Total Bilirubin: 1 mg/dL (ref 0.3–1.2)
Total Protein: 7.7 g/dL (ref 6.5–8.1)

## 2022-09-28 LAB — URINALYSIS, ROUTINE W REFLEX MICROSCOPIC
Bilirubin Urine: NEGATIVE
Glucose, UA: NEGATIVE mg/dL
Ketones, ur: NEGATIVE mg/dL
Leukocytes,Ua: NEGATIVE
Nitrite: NEGATIVE
Protein, ur: NEGATIVE mg/dL
Specific Gravity, Urine: 1.01 (ref 1.005–1.030)
pH: 6 (ref 5.0–8.0)

## 2022-09-28 LAB — I-STAT BETA HCG BLOOD, ED (MC, WL, AP ONLY): I-stat hCG, quantitative: <5 m[IU]/mL (ref <5)

## 2022-09-28 LAB — LIPASE, BLOOD: Lipase: 49 U/L (ref 11–51)

## 2022-09-28 MED ORDER — SODIUM CHLORIDE 0.9 % IV BOLUS
1000.0000 mL | Freq: Once | INTRAVENOUS | Status: AC
Start: 2022-09-28 — End: 2022-09-28
  Administered 2022-09-28: 1000 mL via INTRAVENOUS

## 2022-09-28 MED ORDER — IOHEXOL 300 MG/ML  SOLN
100.0000 mL | Freq: Once | INTRAMUSCULAR | Status: AC | PRN
  Administered 2022-09-28: 100 mL via INTRAVENOUS

## 2022-09-28 MED ORDER — MORPHINE SULFATE (PF) 4 MG/ML IV SOLN
4.0000 mg | Freq: Once | INTRAVENOUS | Status: AC
  Administered 2022-09-28: 4 mg via INTRAVENOUS
  Filled 2022-09-28: qty 1

## 2022-09-28 MED ORDER — ONDANSETRON HCL 4 MG/2ML IJ SOLN
4.0000 mg | Freq: Once | INTRAMUSCULAR | Status: AC
  Administered 2022-09-28: 4 mg via INTRAVENOUS
  Filled 2022-09-28: qty 2

## 2022-09-28 MED ORDER — KETOROLAC TROMETHAMINE 15 MG/ML IJ SOLN
15.0000 mg | Freq: Once | INTRAMUSCULAR | Status: AC
  Administered 2022-09-28: 15 mg via INTRAVENOUS
  Filled 2022-09-28: qty 1

## 2022-09-28 NOTE — ED Provider Triage Note (Signed)
Emergency Medicine Provider Triage Evaluation Note  Shelley Mcdaniel , a 47 y.o. female  was evaluated in triage.  Pt complains of abdominal and back pain.  She states that she had low back pain beginning on Friday and then yesterday started having abdominal pain.  She describes it as generalized, cramping.  She states the back pain feels like when she had uterine fibroids.  She denies having nausea, vomiting, diarrhea, dysuria, vaginal discharge or bleeding, fevers. .  Review of Systems  Positive:  Negative: See above  Physical Exam  BP 114/79 (BP Location: Left Arm)   Pulse 77   Temp 97.7 F (36.5 C) (Oral)   Resp 18   SpO2 100%  Gen:   Awake, no distress   Resp:  Normal effort  MSK:   Moves extremities without difficulty  Other:  Abdomen is generally tender to palpation without guarding or rebound.  Medical Decision Making  Medically screening exam initiated at 12:28 PM.  Appropriate orders placed.  Shelley Mcdaniel was informed that the remainder of the evaluation will be completed by another provider, this initial triage assessment does not replace that evaluation, and the importance of remaining in the ED until their evaluation is complete.     Mickie Hillier, PA-C 09/28/22 1228

## 2022-09-28 NOTE — Discharge Instructions (Signed)
If you develop worsening, continued, or recurrent abdominal pain, uncontrolled vomiting, fever, chest or back pain, or any other new/concerning symptoms then return to the ER for evaluation.  

## 2022-09-28 NOTE — ED Triage Notes (Signed)
Pt arrived via POV, c/o abd pain, n/v, back pain. States similar sx when she had fibroids in the past.

## 2022-09-28 NOTE — ED Provider Notes (Signed)
[place] [facility] Provider Note   CSN: [id number] Arrival date & time: 09/28/22  1207     History  Chief Complaint  Patient presents with   Abdominal Pain    Shelley Mcdaniel is a 47 y.o. female.  HPI 47 year old female with a history of fibroids with previous uterine surgery as well as chronic anemia that is being worked up presents with back and abdominal pain.  She had back pain starting the evening of 3/22.  She has been getting an outpatient workup for her anemia and actually had a CT scan on 3/22 but then started having low back pain afterwards.  Back pain is diffuse in her low back.  She now has developed abdominal pain over the last 24 hours.  Is primarily in the lower abdominal.  Some nausea but no vomiting.  She just recently got off her menstrual cycle.  She has not had any diarrhea or urinary symptoms.  She is taken Aleve.  The pain is relatively severe at this time.  Home Medications Prior to Admission medications   Medication Sig Start Date End Date Taking? Authorizing Provider  cyclobenzaprine (FLEXERIL) 10 MG tablet Take 1 tablet (10 mg total) by mouth 3 (three) times daily as needed for muscle spasms. 09/19/17   Rolland Porter, MD  ferrous sulfate 325 (65 FE) MG tablet Take 1 tablet (325 mg total) by mouth 3 (three) times daily with meals. 09/19/17   Rolland Porter, MD  naproxen (NAPROSYN) 500 MG tablet Take 1 po BID with food prn pain 09/19/17   Rolland Porter, MD      Allergies    Patient has no known allergies.    Review of Systems   Review of Systems  Respiratory:  Negative for shortness of breath.   Gastrointestinal:  Positive for abdominal pain and nausea. Negative for diarrhea and vomiting.  Genitourinary:  Negative for dysuria and vaginal bleeding.  Musculoskeletal:  Positive for back pain.  Neurological:  Negative for weakness and numbness.    Physical Exam Updated Vital Signs BP 125/73   Pulse (!) 57   Temp 97.7 F (36.5  C)   Resp 13   SpO2 100%  Physical Exam Vitals and nursing note reviewed.  Constitutional:      Appearance: She is well-developed.  HENT:     Head: Normocephalic and atraumatic.  Cardiovascular:     Rate and Rhythm: Normal rate and regular rhythm.     Heart sounds: Normal heart sounds.  Pulmonary:     Effort: Pulmonary effort is normal.     Breath sounds: Normal breath sounds.  Abdominal:     Palpations: Abdomen is soft.     Tenderness: There is abdominal tenderness (RLQ>LLQ) in the right lower quadrant and left lower quadrant.  Musculoskeletal:     Comments: Diffuse bilateral and midline lumbar tenderness.   Skin:    General: Skin is warm and dry.  Neurological:     Mental Status: She is alert.     Comments: 5/5 strength in BLE. Grossly normal sensation     ED Results / Procedures / Treatments   Labs (all labs ordered are listed, but only abnormal results are displayed) Labs Reviewed  CBC WITH DIFFERENTIAL/PLATELET - Abnormal; Notable for the following components:      Result Value   Hemoglobin 10.6 (*)    HCT 34.7 (*)    All other components within normal limits  URINALYSIS, ROUTINE W REFLEX MICROSCOPIC - Abnormal; Notable  for the following components:   APPearance CLOUDY (*)    Hgb urine dipstick MODERATE (*)    Bacteria, UA FEW (*)    All other components within normal limits  COMPREHENSIVE METABOLIC PANEL  LIPASE, BLOOD  I-STAT BETA HCG BLOOD, ED ([facility], [facility], [facility] ONLY)    EKG None  Radiology US Pelvis Complete  Result Date: 09/28/2022 CLINICAL DATA:  Right lower quadrant pain EXAM: TRANSABDOMINAL AND TRANSVAGINAL ULTRASOUND OF PELVIS DOPPLER ULTRASOUND OF OVARIES TECHNIQUE: Both transabdominal and transvaginal ultrasound examinations of the pelvis were performed. Transabdominal technique was performed for global imaging of the pelvis including uterus, ovaries, adnexal regions, and pelvic cul-de-sac. It was necessary to proceed with endovaginal exam following the  transabdominal exam to visualize the uterus endometrium adnexa. Color and duplex Doppler ultrasound was utilized to evaluate blood flow to the ovaries. COMPARISON:  CT 09/28/2022 FINDINGS: Uterus Measurements: 16.9 x 10.6 x 13.5 cm = volume: 1261.1 mL. Multiple large fibroids. The largest fibroids are measured. Left uterine corpus fibroid measures 9.9 x 6.8 x 7 6 cm. Fundal fibroid measures 6.9 x 5 x 5.7 cm. Posterior uterine corpus fibroid measures 7.7 x 4.9 x 5.7 cm. Endometrium Thickness: 10.8 mm.  No focal abnormality visualized. Right ovary Measurements: 3 x 2.3 x 2.9 cm = volume: 10.3 mL. Normal appearance/no adnexal mass. Left ovary Measurements: 2.8 x 1.5 x 2.6 cm = volume: 5.8 mL. Normal appearance/no adnexal mass. Pulsed Doppler evaluation of both ovaries demonstrates normal low-resistance arterial and venous waveforms. Other findings Small free fluid IMPRESSION: Enlarged uterus with multiple large fibroids. Small free fluid in the pelvis. Negative for ovarian torsion Electronically Signed   By: Donavan Foil M.D.   On: 09/28/2022 20:41   US Transvaginal Non-OB  Result Date: 09/28/2022 CLINICAL DATA:  Right lower quadrant pain EXAM: TRANSABDOMINAL AND TRANSVAGINAL ULTRASOUND OF PELVIS DOPPLER ULTRASOUND OF OVARIES TECHNIQUE: Both transabdominal and transvaginal ultrasound examinations of the pelvis were performed. Transabdominal technique was performed for global imaging of the pelvis including uterus, ovaries, adnexal regions, and pelvic cul-de-sac. It was necessary to proceed with endovaginal exam following the transabdominal exam to visualize the uterus endometrium adnexa. Color and duplex Doppler ultrasound was utilized to evaluate blood flow to the ovaries. COMPARISON:  CT 09/28/2022 FINDINGS: Uterus Measurements: 16.9 x 10.6 x 13.5 cm = volume: 1261.1 mL. Multiple large fibroids. The largest fibroids are measured. Left uterine corpus fibroid measures 9.9 x 6.8 x 7 6 cm. Fundal fibroid measures  6.9 x 5 x 5.7 cm. Posterior uterine corpus fibroid measures 7.7 x 4.9 x 5.7 cm. Endometrium Thickness: 10.8 mm.  No focal abnormality visualized. Right ovary Measurements: 3 x 2.3 x 2.9 cm = volume: 10.3 mL. Normal appearance/no adnexal mass. Left ovary Measurements: 2.8 x 1.5 x 2.6 cm = volume: 5.8 mL. Normal appearance/no adnexal mass. Pulsed Doppler evaluation of both ovaries demonstrates normal low-resistance arterial and venous waveforms. Other findings Small free fluid IMPRESSION: Enlarged uterus with multiple large fibroids. Small free fluid in the pelvis. Negative for ovarian torsion Electronically Signed   By: Donavan Foil M.D.   On: 09/28/2022 20:41   [place] Art/Ven Flow Abd Pelv Doppler  Result Date: 09/28/2022 CLINICAL DATA:  Right lower quadrant pain EXAM: TRANSABDOMINAL AND TRANSVAGINAL ULTRASOUND OF PELVIS DOPPLER ULTRASOUND OF OVARIES TECHNIQUE: Both transabdominal and transvaginal ultrasound examinations of the pelvis were performed. Transabdominal technique was performed for global imaging of the pelvis including uterus, ovaries, adnexal regions, and pelvic cul-de-sac. It was necessary to proceed with endovaginal  exam following the transabdominal exam to visualize the uterus endometrium adnexa. Color and duplex Doppler ultrasound was utilized to evaluate blood flow to the ovaries. COMPARISON:  CT 09/28/2022 FINDINGS: Uterus Measurements: 16.9 x 10.6 x 13.5 cm = volume: 1261.1 mL. Multiple large fibroids. The largest fibroids are measured. Left uterine corpus fibroid measures 9.9 x 6.8 x 7 6 cm. Fundal fibroid measures 6.9 x 5 x 5.7 cm. Posterior uterine corpus fibroid measures 7.7 x 4.9 x 5.7 cm. Endometrium Thickness: 10.8 mm.  No focal abnormality visualized. Right ovary Measurements: 3 x 2.3 x 2.9 cm = volume: 10.3 mL. Normal appearance/no adnexal mass. Left ovary Measurements: 2.8 x 1.5 x 2.6 cm = volume: 5.8 mL. Normal appearance/no adnexal mass. Pulsed Doppler evaluation of both ovaries  demonstrates normal low-resistance arterial and venous waveforms. Other findings Small free fluid IMPRESSION: Enlarged uterus with multiple large fibroids. Small free fluid in the pelvis. Negative for ovarian torsion Electronically Signed   By: Donavan Foil M.D.   On: 09/28/2022 20:41   CT ABDOMEN PELVIS W CONTRAST  Result Date: 09/28/2022 CLINICAL DATA:  Right lower quadrant abdominal pain. Nausea and vomiting. Back pain EXAM: CT ABDOMEN AND PELVIS WITH CONTRAST TECHNIQUE: Multidetector CT imaging of the abdomen and pelvis was performed using the standard protocol following bolus administration of intravenous contrast. RADIATION DOSE REDUCTION: This exam was performed according to the departmental dose-optimization program which includes automated exposure control, adjustment of the mA and/or kV according to patient size and/or use of iterative reconstruction technique. CONTRAST:  171mL OMNIPAQUE IOHEXOL 300 MG/ML  SOLN COMPARISON:  None Available. FINDINGS: Lower chest: Basilar atelectasis. No pleural effusion. Slight breathing motion. Hepatobiliary: Diffuse fatty liver infiltration. Patent portal vein. Gallbladder is nondilated. Pancreas: Unremarkable. No pancreatic ductal dilatation or surrounding inflammatory changes. Spleen: Spleen is nonenlarged. Preserved enhancement. Small splenule anterior inferior. Adrenals/Urinary Tract: Adrenal glands are unremarkable. Kidneys are normal, without renal calculi, focal lesion, or hydronephrosis. Bladder is unremarkable. Stomach/Bowel: Scattered colonic stool, moderate. No bowel obstruction, free air or free fluid. The appendix is not clearly seen in the right lower quadrant but no pericecal stranding or fluid. Vascular/Lymphatic: Normal caliber aorta and IVC. No specific abnormal lymph node enlargement identified in the abdomen and pelvis. Reproductive: There is an enlarged lobular uterus consistent with a fibroid uterus. Dimensions overall of the uterus approach  18.1 x 8.9 by 13.7 cm. Fibroids are diffusely distributed. There is distortion of the underlying endometrium. There is some fluid along the endometrium as well. Please correlate with patient's menstrual cycle. Other: No abdominal wall hernia or abnormality. No abdominopelvic ascites. Musculoskeletal: No acute or significant osseous findings. IMPRESSION: No bowel obstruction, free air or free fluid. Scattered stool. The appendix is poorly seen but no pericecal stranding or fluid. Fatty liver infiltration. Enlarged myomatous uterus. There is fluid along the endometrium. The endometrium is also distorted. Please correlate with clinical findings in the patient's menstrual cycle. If there is further concern of the extent and distribution of fibroids, a dedicated female pelvic MRI for fibroids could be considered when clinically appropriate Electronically Signed   By: Jill Side M.D.   On: 09/28/2022 19:04    Procedures Procedures    Medications Ordered in ED Medications  sodium chloride 0.9 % bolus 1,000 mL (0 mLs Intravenous Stopped 09/28/22 1958)  ondansetron (ZOFRAN) injection 4 mg (4 mg Intravenous Given 09/28/22 1657)  morphine (PF) 4 MG/ML injection 4 mg (4 mg Intravenous Given 09/28/22 1657)  iohexol (OMNIPAQUE) 300 MG/ML solution 100 mL (  100 mLs Intravenous Contrast Given 09/28/22 1833)  ketorolac (TORADOL) 15 MG/ML injection 15 mg (15 mg Intravenous Given 09/28/22 1953)    ED Course/ Medical Decision Making/ A&P Clinical Course as of 09/28/22 2235  Mon Sep 28, 2022  1630 Patient had a CT performed on 3/22. Has not been read by radiology at this time.  However she is quite tender in her right lower quadrant and her symptoms seem to start after her CT.  Given this I think it is reasonable to do another which is unfortunate timing.  She did mention she got nauseated after the contrast but no allergic reaction.  Will give Zofran prior to the CT. [SG]    Clinical Course User Index [SG] Sherwood Gambler, MD                             Medical Decision Making Amount and/or Complexity of Data Reviewed Labs:     Details: Normal WBC. Not pregnant. No UTI. Radiology: ordered and independent interpretation performed.    Details: CT with very large/fibroid uterus.   Risk Prescription drug management.   Patient presents with back pain which is similar to when she had issues with fibroids in the past.  She does have some abdominal tenderness, right worse than left and so CT was obtained.  Cannot find the appendix but no overt appendicitis or secondary signs such as inflammation.  Overall given her history this is probably more from her fibroids themselves.  No evidence of torsion.  Patient was given a dose of IV morphine which did help her symptoms.  She was also given Toradol.  Overall, she prefers to not have a prescription upon discharge and will take Aleve.  She has GYN follow-up in the next 2 weeks.  No emergent indication for surgery or antibiotics.  Will discharge home with return precautions.  I doubt this is a case of missed appendicitis despite not seeing the appendix based on the above.        Final Clinical Impression(s) / ED Diagnoses Final diagnoses:  Uterine leiomyoma, unspecified location    Rx / DC Orders ED Discharge Orders     None         Sherwood Gambler, MD 09/28/22 2246

## 2022-10-01 ENCOUNTER — Encounter: Payer: BC Managed Care – PPO | Admitting: Radiology

## 2022-10-01 ENCOUNTER — Encounter (HOSPITAL_BASED_OUTPATIENT_CLINIC_OR_DEPARTMENT_OTHER): Payer: Self-pay | Admitting: Family Medicine

## 2022-10-01 DIAGNOSIS — R61 Generalized hyperhidrosis: Secondary | ICD-10-CM

## 2022-10-08 ENCOUNTER — Encounter: Payer: BC Managed Care – PPO | Admitting: Radiology
